# Patient Record
Sex: Female | Born: 2000
Health system: Southern US, Community
[De-identification: ages and names within clinical notes are randomized; demographics above are authoritative.]

---
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
## 2018-04-30 DIAGNOSIS — Z30430 Encounter for insertion of intrauterine contraceptive device: Secondary | ICD-10-CM | POA: Diagnosis not present

## 2018-04-30 DIAGNOSIS — Z3202 Encounter for pregnancy test, result negative: Secondary | ICD-10-CM | POA: Diagnosis not present

## 2018-05-09 DIAGNOSIS — N898 Other specified noninflammatory disorders of vagina: Secondary | ICD-10-CM | POA: Diagnosis not present

## 2018-05-09 DIAGNOSIS — Z30431 Encounter for routine checking of intrauterine contraceptive device: Secondary | ICD-10-CM | POA: Diagnosis not present

## 2018-05-09 DIAGNOSIS — Z113 Encounter for screening for infections with a predominantly sexual mode of transmission: Secondary | ICD-10-CM | POA: Diagnosis not present

## 2018-07-01 DIAGNOSIS — Z30431 Encounter for routine checking of intrauterine contraceptive device: Secondary | ICD-10-CM | POA: Diagnosis not present

## 2018-09-04 DIAGNOSIS — Z975 Presence of (intrauterine) contraceptive device: Secondary | ICD-10-CM | POA: Diagnosis not present

## 2018-10-18 ENCOUNTER — Encounter: Payer: Self-pay | Admitting: Emergency Medicine

## 2018-10-18 ENCOUNTER — Ambulatory Visit
Admission: EM | Admit: 2018-10-18 | Discharge: 2018-10-18 | Disposition: A | Discharge status: Home / Self Care | Payer: BLUE CROSS/BLUE SHIELD | Attending: Family Medicine | Admitting: Family Medicine

## 2018-10-18 ENCOUNTER — Other Ambulatory Visit: Payer: Self-pay

## 2018-10-18 ENCOUNTER — Ambulatory Visit (INDEPENDENT_AMBULATORY_CARE_PROVIDER_SITE_OTHER): Payer: BLUE CROSS/BLUE SHIELD

## 2018-10-18 DIAGNOSIS — S8001XA Contusion of right knee, initial encounter: Secondary | ICD-10-CM

## 2018-10-18 DIAGNOSIS — S83401A Sprain of unspecified collateral ligament of right knee, initial encounter: Secondary | ICD-10-CM

## 2018-10-18 DIAGNOSIS — S93601A Unspecified sprain of right foot, initial encounter: Secondary | ICD-10-CM

## 2018-10-18 DIAGNOSIS — W541XXA Struck by dog, initial encounter: Secondary | ICD-10-CM | POA: Diagnosis not present

## 2018-10-18 DIAGNOSIS — M25561 Pain in right knee: Secondary | ICD-10-CM | POA: Diagnosis not present

## 2018-10-18 DIAGNOSIS — M79671 Pain in right foot: Secondary | ICD-10-CM | POA: Diagnosis not present

## 2018-10-18 DIAGNOSIS — S99921A Unspecified injury of right foot, initial encounter: Secondary | ICD-10-CM | POA: Diagnosis not present

## 2018-10-18 NOTE — ED Triage Notes (Signed)
Patient states that [place] dog knocked her down. Patient c/o right knee and right big toe pain.

## 2018-10-18 NOTE — Discharge Instructions (Addendum)
Continue ice, ibuprofen and acetaminophen, crutches for support Follow up with orthopedist if no improvement over the next week

## 2018-10-18 NOTE — ED Provider Notes (Signed)
[facility]    CSN: [id number] Arrival date & time: 10/18/18  1138     History   Chief Complaint Chief Complaint  Patient presents with  . Fall  . Knee Pain    right  . Toe Pain    right big toe    HPI Claudia Tanner is a 18 y.o. female.   18 yo female with a c/o right knee and right foot pain for the past 5 days after falling when a family dog knocked her down. Patient has been icing and using crutches. Has been able to bear weight but it is painful.   Fall   Knee Pain  Toe Pain     History reviewed. No pertinent past medical history.  There are no active problems to display for this patient.   History reviewed. No pertinent surgical history.  OB History   No obstetric history on file.      Home Medications    Prior to Admission medications   Medication Sig Start Date End Date Taking? Authorizing Provider  MONONESSA 0.25-35 MG-MCG tablet take 1 tablet by mouth once daily 11/02/17   Duanne Limerick, MD  SPRINTEC 28 0.25-35 MG-MCG tablet Take 1 tablet by mouth daily. 03/01/17   [provider]  valACYclovir (VALTREX) 1000 MG tablet Take 1 tablet (1,000 mg total) by mouth daily. 03/15/17   Duanne Limerick, MD    Family History Family History  Problem Relation Age of Onset  . Healthy Mother   . Healthy Father     Social History Social History   Tobacco Use  . Smoking status: Former Smoker    Packs/day: 0.50    Years: 3.00    Pack years: 1.50    Types: Cigarettes  . Smokeless tobacco: Never Used  Substance Use Topics  . Alcohol use: No  . Drug use: No     Allergies   Patient has no known allergies.   Review of Systems Review of Systems   Physical Exam Triage Vital Signs ED Triage Vitals  Enc Vitals Group     BP 10/18/18 1213 111/70     Pulse Rate 10/18/18 1213 61     Resp 10/18/18 1213 14     Temp 10/18/18 1213 98.1 F (36.7 C)     Temp Source 10/18/18 1213 Oral     SpO2 10/18/18 1213 100 %     Weight  10/18/18 1210 130 lb (59 kg)     Height 10/18/18 1210 5\' 6"  (1.676 m)     Head Circumference --      Peak Flow --      Pain Score 10/18/18 1210 5     Pain Loc --      Pain Edu? --      Excl. in GC? --    No data found.  Updated Vital Signs BP 111/70 (BP Location: Right Arm)   Pulse 61   Temp 98.1 F (36.7 C) (Oral)   Resp 14   Ht 5\' 6"  (1.676 m)   Wt 59 kg   SpO2 100%   BMI 20.98 kg/m   Visual Acuity Right Eye Distance:   Left Eye Distance:   Bilateral Distance:    Right Eye Near:   Left Eye Near:    Bilateral Near:     Physical Exam Vitals signs and nursing note reviewed.  Constitutional:      General: She is not in acute distress.    Appearance: She is  not toxic-appearing or diaphoretic.  Musculoskeletal:     Right knee: She exhibits swelling (mild) and bony tenderness. She exhibits normal range of motion, no effusion, no ecchymosis, no deformity, no laceration, no erythema and no MCL laxity. Tenderness (diffuse) found.     Right foot: Normal range of motion and normal capillary refill. Bony tenderness (1st metarsal and mid food area) present. No swelling, crepitus, deformity or laceration.  Neurological:     Mental Status: She is alert.      UC Treatments / Results  Labs (all labs ordered are listed, but only abnormal results are displayed) Labs Reviewed - No data to display  EKG None  Radiology Dg Knee Complete 4 Views Right  Result Date: 10/18/2018 CLINICAL DATA:  Right knee pain EXAM: RIGHT KNEE - COMPLETE 4+ VIEW COMPARISON:  None. FINDINGS: No evidence of fracture, dislocation, or joint effusion. No evidence of arthropathy or other focal bone abnormality. Soft tissues are unremarkable. IMPRESSION: Negative. Electronically Signed   By: Elige KoHetal  Patel   On: 10/18/2018 12:55   Dg Foot Complete Right  Result Date: 10/18/2018 CLINICAL DATA:  Right foot pain status post fall EXAM: RIGHT FOOT COMPLETE - 3+ VIEW COMPARISON:  None. FINDINGS: There is no  evidence of fracture or dislocation. There is no evidence of arthropathy or other focal bone abnormality. Soft tissues are unremarkable. IMPRESSION: Negative. Electronically Signed   By: Elige KoHetal  Patel   On: 10/18/2018 12:55    Procedures Procedures (including critical care time)  Medications Ordered in UC Medications - No data to display  Initial Impression / Assessment and Plan / UC Course  I have reviewed the triage vital signs and the nursing notes.  Pertinent labs & imaging results that were available during my care of the patient were reviewed by me and considered in my medical decision making (see chart for details).      Final Clinical Impressions(s) / UC Diagnoses   Final diagnoses:  Sprain of collateral ligament of right knee, initial encounter  Contusion of right knee, initial encounter  Foot sprain, right, initial encounter     Discharge Instructions     Continue ice, ibuprofen and acetaminophen, crutches for support Follow up with orthopedist if no improvement over the next week    ED Prescriptions    None      1. x-ray results and diagnosis reviewed with patient 2. Recommend supportive treatment as above 3. Follow-up prn if symptoms worsen or don't improve  Controlled Substance Prescriptions [place] Controlled Substance Registry consulted? Not Applicable   Claudia Tanner, Dalaysia Harms, MD 10/18/18 [phone]

## 2018-10-21 DIAGNOSIS — S8391XA Sprain of unspecified site of right knee, initial encounter: Secondary | ICD-10-CM | POA: Diagnosis not present

## 2018-12-11 DIAGNOSIS — Z6823 Body mass index (BMI) 23.0-23.9, adult: Secondary | ICD-10-CM | POA: Diagnosis not present

## 2018-12-11 DIAGNOSIS — Z1389 Encounter for screening for other disorder: Secondary | ICD-10-CM | POA: Diagnosis not present

## 2018-12-11 DIAGNOSIS — Z01419 Encounter for gynecological examination (general) (routine) without abnormal findings: Secondary | ICD-10-CM | POA: Diagnosis not present

## 2018-12-11 DIAGNOSIS — Z113 Encounter for screening for infections with a predominantly sexual mode of transmission: Secondary | ICD-10-CM | POA: Diagnosis not present

## 2019-01-02 ENCOUNTER — Telehealth: Payer: Self-pay | Admitting: Family Medicine

## 2019-01-02 ENCOUNTER — Other Ambulatory Visit: Payer: Self-pay

## 2019-01-02 ENCOUNTER — Emergency Department
Admission: EM | Admit: 2019-01-02 | Discharge: 2019-01-02 | Disposition: A | Discharge status: Home / Self Care | Payer: BC Managed Care – PPO | Attending: Emergency Medicine | Admitting: Emergency Medicine

## 2019-01-02 ENCOUNTER — Emergency Department: Payer: BC Managed Care – PPO

## 2019-01-02 DIAGNOSIS — Z3A01 Less than 8 weeks gestation of pregnancy: Secondary | ICD-10-CM | POA: Diagnosis not present

## 2019-01-02 DIAGNOSIS — R1031 Right lower quadrant pain: Secondary | ICD-10-CM | POA: Insufficient documentation

## 2019-01-02 DIAGNOSIS — O209 Hemorrhage in early pregnancy, unspecified: Secondary | ICD-10-CM | POA: Diagnosis not present

## 2019-01-02 DIAGNOSIS — N939 Abnormal uterine and vaginal bleeding, unspecified: Secondary | ICD-10-CM

## 2019-01-02 DIAGNOSIS — Z87891 Personal history of nicotine dependence: Secondary | ICD-10-CM | POA: Diagnosis not present

## 2019-01-02 DIAGNOSIS — O3680X0 Pregnancy with inconclusive fetal viability, not applicable or unspecified: Secondary | ICD-10-CM | POA: Diagnosis not present

## 2019-01-02 DIAGNOSIS — Z79899 Other long term (current) drug therapy: Secondary | ICD-10-CM | POA: Insufficient documentation

## 2019-01-02 DIAGNOSIS — R1011 Right upper quadrant pain: Secondary | ICD-10-CM | POA: Diagnosis not present

## 2019-01-02 LAB — HCG, QUANTITATIVE, PREGNANCY: hCG, Beta Chain, Quant, S: 604 m[IU]/mL — ABNORMAL HIGH (ref <5)

## 2019-01-02 LAB — URINALYSIS, COMPLETE (UACMP) WITH MICROSCOPIC
Bacteria, UA: NONE SEEN
Bilirubin Urine: NEGATIVE
Glucose, UA: NEGATIVE mg/dL
Ketones, ur: NEGATIVE mg/dL
Leukocytes,Ua: NEGATIVE
Nitrite: NEGATIVE
Protein, ur: NEGATIVE mg/dL
Specific Gravity, Urine: 1.025 (ref 1.005–1.030)
pH: 6 (ref 5.0–8.0)

## 2019-01-02 LAB — COMPREHENSIVE METABOLIC PANEL
ALT: 17 U/L (ref 0–44)
AST: 19 U/L (ref 15–41)
Albumin: 4.3 g/dL (ref 3.5–5.0)
Alkaline Phosphatase: 75 U/L (ref 38–126)
Anion gap: 9 (ref 5–15)
BUN: 11 mg/dL (ref 6–20)
CO2: 25 mmol/L (ref 22–32)
Calcium: 9.5 mg/dL (ref 8.9–10.3)
Chloride: 104 mmol/L (ref 98–111)
Creatinine, Ser: 0.55 mg/dL (ref 0.44–1.00)
GFR calc Af Amer: >60 mL/min (ref >60)
GFR calc non Af Amer: >60 mL/min (ref >60)
Glucose, Bld: 90 mg/dL (ref 70–99)
Potassium: 4.2 mmol/L (ref 3.5–5.1)
Sodium: 138 mmol/L (ref 135–145)
Total Bilirubin: 0.6 mg/dL (ref 0.3–1.2)
Total Protein: 7.3 g/dL (ref 6.5–8.1)

## 2019-01-02 LAB — CBC
HCT: 43.2 % (ref 36.0–46.0)
Hemoglobin: 14.4 g/dL (ref 12.0–15.0)
MCH: 30.7 pg (ref 26.0–34.0)
MCHC: 33.3 g/dL (ref 30.0–36.0)
MCV: 92.1 fL (ref 80.0–100.0)
Platelets: 238 10*3/uL (ref 150–400)
RBC: 4.69 MIL/uL (ref 3.87–5.11)
RDW: 11.7 % (ref 11.5–15.5)
WBC: 7.8 10*3/uL (ref 4.0–10.5)
nRBC: 0 % (ref 0.0–0.2)

## 2019-01-02 LAB — POCT PREGNANCY, URINE: Preg Test, Ur: POSITIVE — AB

## 2019-01-02 LAB — LIPASE, BLOOD: Lipase: 28 U/L (ref 11–51)

## 2019-01-02 LAB — ABO/RH: ABO/RH(D): B POS

## 2019-01-02 MED ORDER — SODIUM CHLORIDE 0.9% FLUSH: 3.0000 mL | Freq: Once | INTRAVENOUS | Status: DC

## 2019-01-02 NOTE — Telephone Encounter (Signed)
Having pain in arm/ chest and abd- go to [place] spoke to her

## 2019-01-02 NOTE — Discharge Instructions (Addendum)
Your hCG level was 604.  Your ultrasound shows no obvious intrauterine pregnancy or ectopic pregnancy.  You need to come back on Saturday morning for repeat hCG level and reevaluation.  If prior to that he developed worsening abdominal pain, fevers, vaginal bleeding you should return to the ER immediately.  I am concerned that this could be an ectopic pregnancy.  You should call your OB/GYN doctor to have a appointment scheduled for Monday.  Please have low threshold to return to the ER

## 2019-01-02 NOTE — ED Notes (Signed)
Dr Jari Pigg at bedside performing pelvic exam w/ [facility] EDT.

## 2019-01-02 NOTE — ED Notes (Signed)
Patient transported to Ultrasound 

## 2019-01-02 NOTE — ED Triage Notes (Signed)
Pt comes via POV from home with c/o abdominal pain that started last night. Pt states she was in a store and had a sharp pain shoot down from her collar bone and shoulder.  Pt states it hurts in her belly to cough. Pt states it is under her right rib.  Pt denies any recent injuries or falls.

## 2019-01-02 NOTE — ED Provider Notes (Signed)
[facility] Emergency Department Provider Note  ____________________________________________   First MD Initiated Contact with Patient 01/02/19 1249     (approximate)  I have reviewed the triage vital signs and the nursing notes.   HISTORY  Chief Complaint Abdominal Pain    HPI Claudia Tanner is a 18 y.o. female otherwise healthy who presents with abdominal pain that started last night.  Patient endorses having right upper quadrant and lower quadrant abdominal pain that started yesterday, intermittent, worse with moving around, nothing makes it better.  Patient does have an IUD.  Her last menstrual period was on August 2 which came little bit earlier than normal.  It was heavier in nature.  Denies any vomiting or fevers Denies chest pain, sob.           History reviewed. No pertinent past medical history.  There are no active problems to display for this patient.   History reviewed. No pertinent surgical history.  Prior to Admission medications   Medication Sig Start Date End Date Taking? Authorizing Provider  MONONESSA 0.25-35 MG-MCG tablet take 1 tablet by mouth once daily 11/02/17   Juline Patch, MD  SPRINTEC 28 0.25-35 MG-MCG tablet Take 1 tablet by mouth daily. 03/01/17   [provider]  valACYclovir (VALTREX) 1000 MG tablet Take 1 tablet (1,000 mg total) by mouth daily. 03/15/17   Juline Patch, MD    Allergies Patient has no known allergies.  Family History  Problem Relation Age of Onset  . Healthy Mother   . Healthy Father     Social History Social History   Tobacco Use  . Smoking status: Former Smoker    Packs/day: 0.50    Years: 3.00    Pack years: 1.50    Types: Cigarettes  . Smokeless tobacco: Never Used  Substance Use Topics  . Alcohol use: No  . Drug use: No      Review of Systems Constitutional: No fever/chills Eyes: No visual changes. ENT: No sore throat. Cardiovascular: Denies chest pain.  Respiratory: Denies shortness of breath. Gastrointestinal: positive abdominal pain. No nausea, no vomiting.  No diarrhea.  No constipation. Genitourinary: Negative for dysuria. Musculoskeletal: Negative for back pain. Skin: Negative for rash. Neurological: Negative for headaches, focal weakness or numbness. All other ROS negative ____________________________________________   PHYSICAL EXAM:  VITAL SIGNS: ED Triage Vitals [01/02/19 1108]  Enc Vitals Group     BP 121/70     Pulse Rate 76     Resp 18     Temp 99.3 F (37.4 C)     Temp src      SpO2 100 %     Weight 140 lb (63.5 kg)     Height 5\' 5"  (1.651 m)     Head Circumference      Peak Flow      Pain Score 5     Pain Loc      Pain Edu?      Excl. in [place]?     Constitutional: Alert and oriented. Well appearing and in no acute distress. Eyes: Conjunctivae are normal. EOMI. Head: Atraumatic. Nose: No congestion/rhinnorhea. Mouth/Throat: Mucous membranes are moist.   Neck: No stridor. Trachea Midline. FROM Cardiovascular: Normal rate, regular rhythm. Grossly normal heart sounds.  Good peripheral circulation. Respiratory: Normal respiratory effort.  No retractions. Lungs CTAB. Gastrointestinal: tender in the RLQ and RUQ. No distention. No abdominal bruits.  Musculoskeletal: No lower extremity tenderness nor edema.  No joint effusions. Neurologic:  Normal speech and language. No gross focal neurologic deficits are appreciated.  Skin:  Skin is warm, dry and intact. No rash noted. Psychiatric: Mood and affect are normal. Speech and behavior are normal. GU: Deferred   ____________________________________________   LABS (all labs ordered are listed, but only abnormal results are displayed)  Labs Reviewed  URINALYSIS, COMPLETE (UACMP) WITH MICROSCOPIC - Abnormal; Notable for the following components:      Result Value   Color, Urine YELLOW (*)    APPearance CLOUDY (*)    Hgb urine dipstick SMALL (*)    All other  components within normal limits  HCG, QUANTITATIVE, PREGNANCY - Abnormal; Notable for the following components:   hCG, Beta Chain, Quant, S 604 (*)    All other components within normal limits  POCT PREGNANCY, URINE - Abnormal; Notable for the following components:   Preg Test, Ur POSITIVE (*)    All other components within normal limits  LIPASE, BLOOD  COMPREHENSIVE METABOLIC PANEL  CBC  POC URINE PREG, ED  ABO/RH   ____________________________________________  RADIOLOGY   Official radiology report(s): [place] Ob Comp Less 14 Wks  Result Date: 01/02/2019 CLINICAL DATA:  Vaginal bleeding, IUD, positive UPT EXAM: TRANSVAGINAL OB ULTRASOUND; OBSTETRIC <14 WK ULTRASOUND TECHNIQUE: Transvaginal ultrasound was performed for complete evaluation of the gestation as well as the maternal uterus, adnexal regions, and pelvic cul-de-sac. COMPARISON:  None. FINDINGS: Gestational age by LMP: 4 weeks 1 day Intrauterine gestational sac: None Yolk sac:  Not Visualized. Embryo:  Not Visualized. Cardiac Activity: Not Visualized. Subchorionic hemorrhage:  None visualized. Maternal uterus/adnexae: Low positioning of the intrauterine device with the body of the IUD positioned low within the cervical canal. Remainder of the maternal structures are unremarkable. No visualized adnexal fluid collection or masses. Small to moderate volume of anechoic free fluid is noted in the cul-de-sac. IMPRESSION: Low positioning of the IUD with the body positioned within the cervical canal. Small to moderate volume free fluid noted within the posterior cul-de-sac, nonspecific. No intrauterine pregnancy visualized. Differential considerations would include early intrauterine pregnancy too early to visualize, spontaneous abortion, or occult ectopic pregnancy. Recommend close clinical followup and serial quantitative beta HCGs and ultrasounds. Electronically Signed   By: Kreg ShropshirePrice  DeHay M.D.   On: 01/02/2019 13:43   [place] Ob Transvaginal   Result Date: 01/02/2019 CLINICAL DATA:  Vaginal bleeding, IUD, positive UPT EXAM: TRANSVAGINAL OB ULTRASOUND; OBSTETRIC <14 WK ULTRASOUND TECHNIQUE: Transvaginal ultrasound was performed for complete evaluation of the gestation as well as the maternal uterus, adnexal regions, and pelvic cul-de-sac. COMPARISON:  None. FINDINGS: Gestational age by LMP: 4 weeks 1 day Intrauterine gestational sac: None Yolk sac:  Not Visualized. Embryo:  Not Visualized. Cardiac Activity: Not Visualized. Subchorionic hemorrhage:  None visualized. Maternal uterus/adnexae: Low positioning of the intrauterine device with the body of the IUD positioned low within the cervical canal. Remainder of the maternal structures are unremarkable. No visualized adnexal fluid collection or masses. Small to moderate volume of anechoic free fluid is noted in the cul-de-sac. IMPRESSION: Low positioning of the IUD with the body positioned within the cervical canal. Small to moderate volume free fluid noted within the posterior cul-de-sac, nonspecific. No intrauterine pregnancy visualized. Differential considerations would include early intrauterine pregnancy too early to visualize, spontaneous abortion, or occult ectopic pregnancy. Recommend close clinical followup and serial quantitative beta HCGs and ultrasounds. Electronically Signed   By: Kreg ShropshirePrice  DeHay M.D.   On: 01/02/2019 13:43    ____________________________________________   PROCEDURES  Procedure(s) performed (  including Critical Care):  Procedures   ____________________________________________   INITIAL IMPRESSION / ASSESSMENT AND PLAN / ED COURSE  Evon SlackShelby N Digilio was evaluated in Emergency Department on 01/02/2019 for the symptoms described in the history of present illness. She was evaluated in the context of the global COVID-19 pandemic, which necessitated consideration that the patient might be at risk for infection with the SARS-CoV-2 virus that causes COVID-19. Institutional  protocols and algorithms that pertain to the evaluation of patients at risk for COVID-19 are in a state of rapid change based on information released by regulatory bodies including the CDC and federal and state organizations. These policies and algorithms were followed during the patient's care in the ED.    Patient is a well-appearing 18 year old who presents with right side abdominal pain.  Patient has a positive pregnancy test so I am concerned mostly about ectopic versus intrauterine pregnancy.  Will get transvaginal ultrasound and hCG quantitative to further evaluate.  I considered appendicitis although patient is afebrile and has had no vomiting so seems less likely.  Considered gallstones although patient has normal LFTs and her pain is both right upper and right lower quadrant.  Will get labs to evaluate for anemia as well as electrolyte abnormalities.  Discussed with patient the possibility of STDs.  Patient was just recently tested 2 weeks ago and was negative.  She has had only one sexual partner over the past few years.  Lipase is normal therefore lower suspicion for pancreatitis.  Normal LFTs and electrolytes.  No evidence of leukocytosis to suggest infection.  UA with 21-50 squamous cells but no evidence of bacteria.  Pregnancy test is positive.  hCG was 604.  Ultrasound did not show evidence of a intrauterine pregnancy or obvious ectopic pregnancy.  I discussed with Dr. Valentino Saxonherry from OB/GYN my concern for ectopic pregnancy given the free fluid as well as no intrauterine pregnancy.   Given that she is hemodynamically stable they would not want to admit her at this time. They would recommend repeat beta-hCG in 2 days.  Given this will be on a Saturday they will have to come back to the ER to have this done given the OB clinic will be closed.  She also recommends coming back for increasing pain recommended taking Tylenol 500.  Otherwise she should schedule follow-up for Monday morning.  GU exam  closed cervix with strings from IUD  3:13 PM evaluate patient.  Patient's vital signs are stable and is having minimal right lower quadrant pain.  Patient understands my concern for ectopic.  She understands that she needs to have a low threshold to return to the ER if she develops any new symptoms or worsening pain.  Otherwise she will come back on Saturday for reevaluation and repeat hCG        ____________________________________________   FINAL CLINICAL IMPRESSION(S) / ED DIAGNOSES   Final diagnoses:  Pregnancy of unknown anatomic location      MEDICATIONS GIVEN DURING THIS VISIT:  Medications  sodium chloride flush (NS) 0.9 % injection 3 mL (has no administration in time range)     ED Discharge Orders    None       Note:  This document was prepared using Dragon voice recognition software and may include unintentional dictation errors.   Concha SeFunke, Michaelanthony Kempton E, MD 01/02/19 1525

## 2019-01-02 NOTE — Telephone Encounter (Signed)
Patient complain last night started with sharp pain under right rib, aslo having pain where her collar bone near right shoulder.

## 2019-01-04 ENCOUNTER — Inpatient Hospital Stay (HOSPITAL_COMMUNITY)
Admission: AD | Admit: 2019-01-04 | Discharge: 2019-01-04 | Disposition: A | Discharge status: Home / Self Care | Payer: BC Managed Care – PPO | Source: Ambulatory Visit | Attending: Obstetrics and Gynecology | Admitting: Obstetrics and Gynecology

## 2019-01-04 ENCOUNTER — Other Ambulatory Visit: Payer: Self-pay

## 2019-01-04 ENCOUNTER — Encounter (HOSPITAL_COMMUNITY): Payer: Self-pay

## 2019-01-04 DIAGNOSIS — O039 Complete or unspecified spontaneous abortion without complication: Secondary | ICD-10-CM

## 2019-01-04 LAB — HCG, QUANTITATIVE, PREGNANCY: hCG, Beta Chain, Quant, S: 521 m[IU]/mL — ABNORMAL HIGH (ref <5)

## 2019-01-04 NOTE — Discharge Instructions (Signed)
-  Dr. Roe Rutherford office will call you by noon on Monday for follow-up appt, if you have not heard from them by noon, give them a call.  -Return to MAU if you develop any increased pain or bleeding.

## 2019-01-04 NOTE — MAU Note (Signed)
ASIANAE MINKLER is a 18 y.o. at [redacted]w[redacted]d here in MAU reporting: here for follow up HCG level, no bleeding, having some left sided abdominal pain that has improved since being seen at [facility]  LMP: 12/22/18, states this was not a normal period for her  Pain score: 2/10  Vitals:   01/04/19 1358  BP: (!) 122/59  Pulse: 75  Resp: 16  Temp: 98.4 F (36.9 C)  SpO2: 99%     Lab orders placed from triage: hcg

## 2019-01-04 NOTE — MAU Provider Note (Addendum)
Patient Claudia Tanner is a 18 y.o. G1P0 At [redacted]w[redacted]d here for follow up bhcg. She was seen at [facility] for right side pain; beta that day as 604 (Aug 13).   Patient denies current pain or bleeding; at night it is hard to sleep because her right side is sore but the pain is much reduced.  She is happy with her IUD but given recent pregnancy, may try a different IUD. Dr. Melba Coon has told her that her her uterus is too small for the copper IUD and that maybe she needs a different size device.   Assessment and Plan   1. Miscarriage    -Beta HCG today is 521, appropriate drop in quant. - Reviewed [place] results and images from 8/13.  - Discussed patient's case with Dr. Terri Piedra, who recommends patient call the office on Monday morning to be seen for follow up beta and repeat US/visit with provider.  -Dr. Terri Piedra requesting chart routed to her; chart routed to her inbox.   Mervyn Skeeters Kooistra 01/04/2019, 3:15 PM

## 2019-01-06 DIAGNOSIS — O021 Missed abortion: Secondary | ICD-10-CM | POA: Diagnosis not present

## 2019-01-07 DIAGNOSIS — Z30432 Encounter for removal of intrauterine contraceptive device: Secondary | ICD-10-CM | POA: Diagnosis not present

## 2019-01-07 DIAGNOSIS — O2630 Retained intrauterine contraceptive device in pregnancy, unspecified trimester: Secondary | ICD-10-CM | POA: Diagnosis not present

## 2019-01-13 DIAGNOSIS — O021 Missed abortion: Secondary | ICD-10-CM | POA: Diagnosis not present

## 2019-01-20 DIAGNOSIS — O021 Missed abortion: Secondary | ICD-10-CM | POA: Diagnosis not present

## 2019-01-28 DIAGNOSIS — O021 Missed abortion: Secondary | ICD-10-CM | POA: Diagnosis not present

## 2019-02-05 DIAGNOSIS — O034 Incomplete spontaneous abortion without complication: Secondary | ICD-10-CM | POA: Diagnosis not present

## 2019-02-18 DIAGNOSIS — Z30430 Encounter for insertion of intrauterine contraceptive device: Secondary | ICD-10-CM | POA: Diagnosis not present

## 2019-02-18 DIAGNOSIS — Z3202 Encounter for pregnancy test, result negative: Secondary | ICD-10-CM | POA: Diagnosis not present

## 2020-02-23 ENCOUNTER — Ambulatory Visit: Payer: Self-pay | Admitting: Podiatry

## 2020-03-02 ENCOUNTER — Ambulatory Visit: Payer: Self-pay | Admitting: Podiatry

## 2020-04-27 IMAGING — US TRANSVAGINAL OB ULTRASOUND
1 series · 13 of 28 positions shown · non-contrast
Comparison: None.

CLINICAL DATA: Vaginal bleeding, IUD, positive UPT

EXAM:
TRANSVAGINAL OB ULTRASOUND; OBSTETRIC <14 WK ULTRASOUND
TECHNIQUE: Transvaginal ultrasound was performed for complete evaluation of the
gestation as well as the maternal uterus, adnexal regions, and
pelvic cul-de-sac.

[Series 1: transvaginal ob ultrasound · 41 acquisitions, 13 frames shown]
[im 2/41]
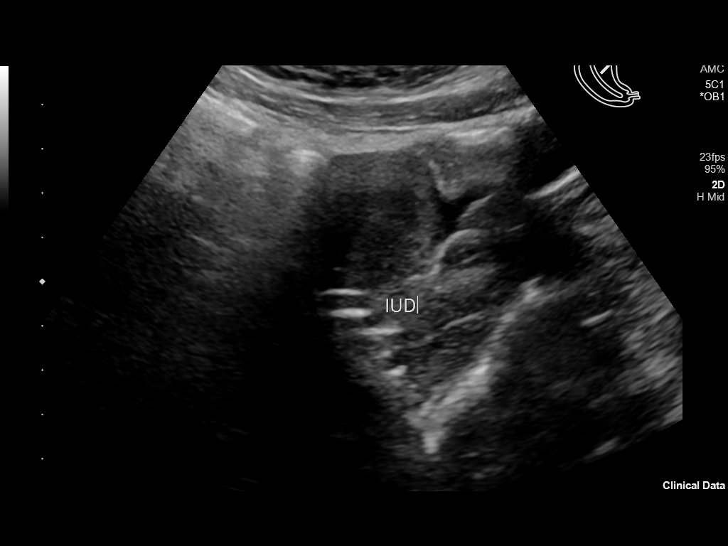
[im 5/41]
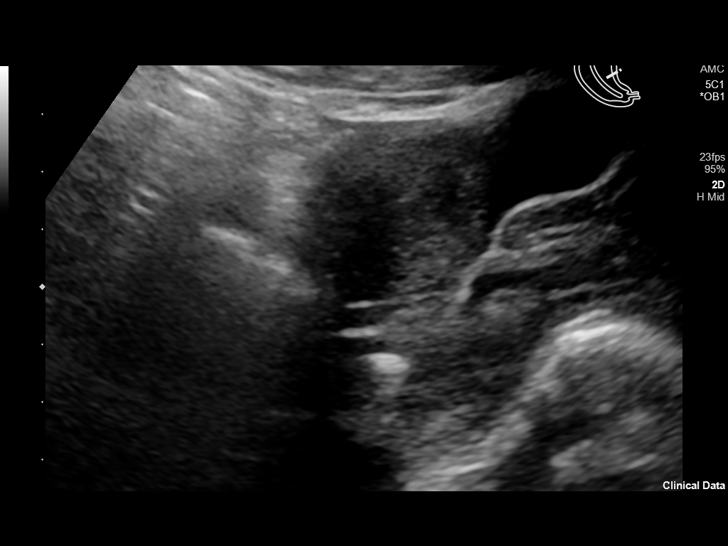
[im 8/41]
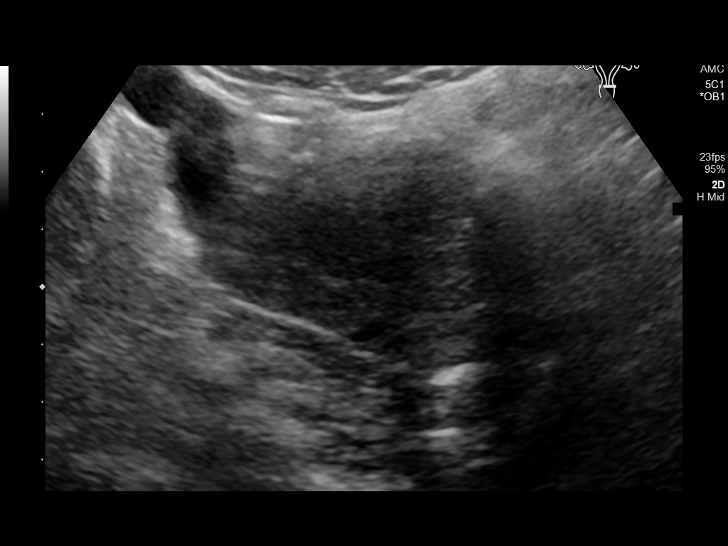
[im 11/41]
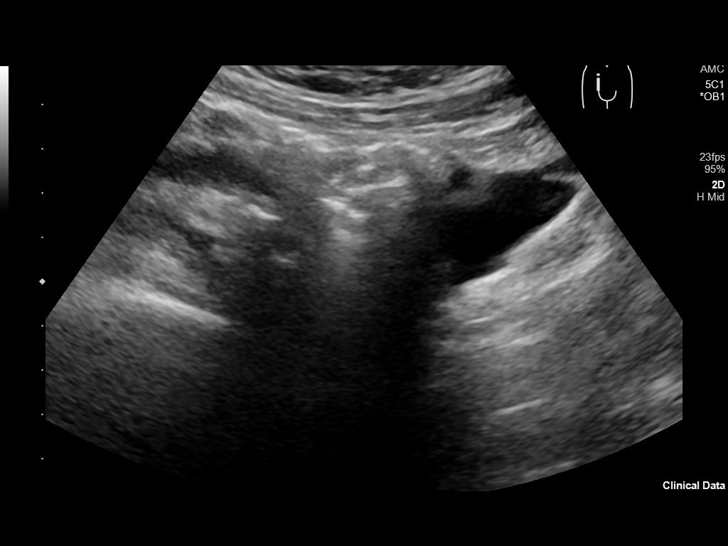
[im 14/41]
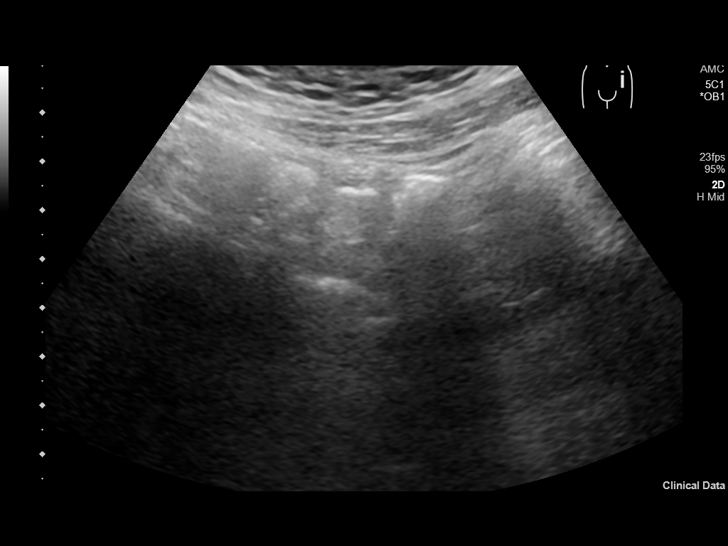
[im 17/41]
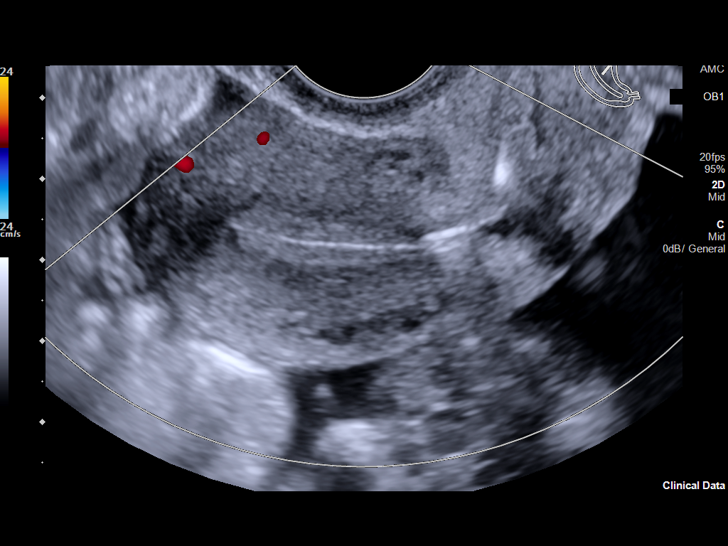
[im 21/41]
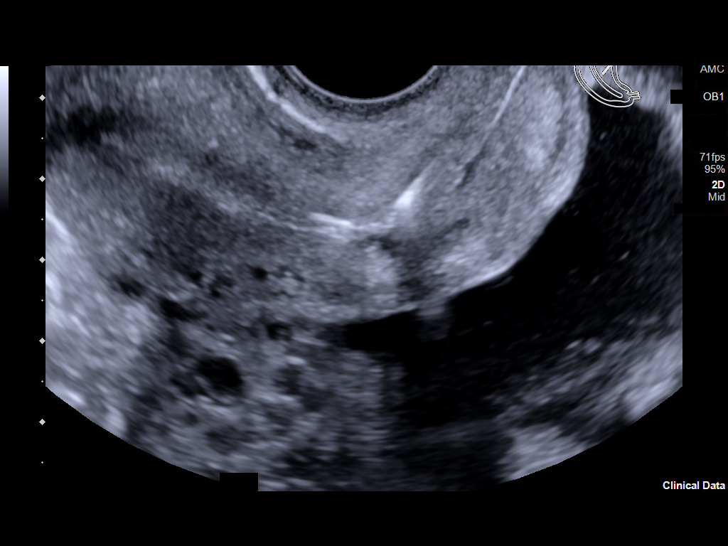
[im 24/41]
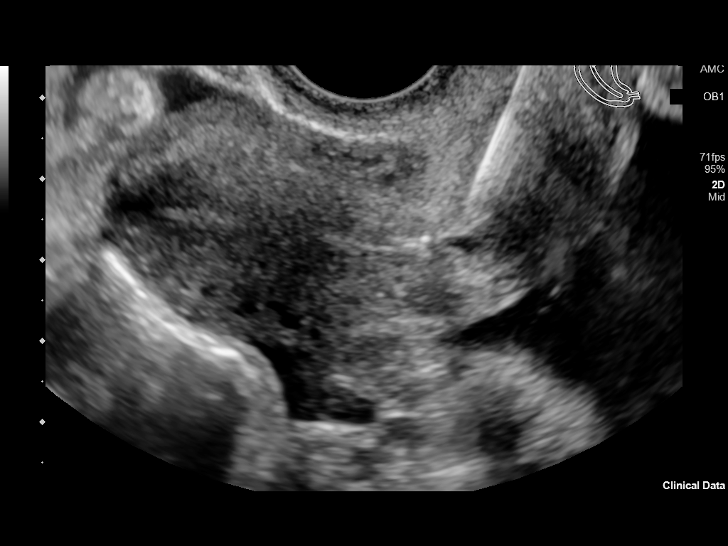
[im 27/41]
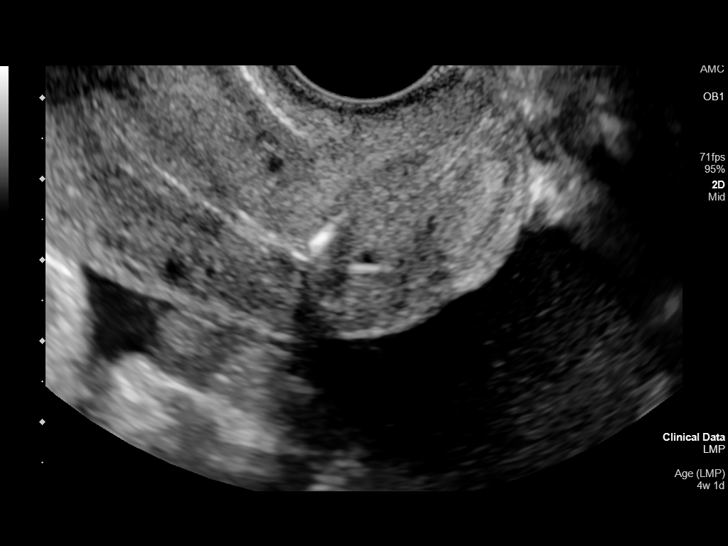
[im 30/41]
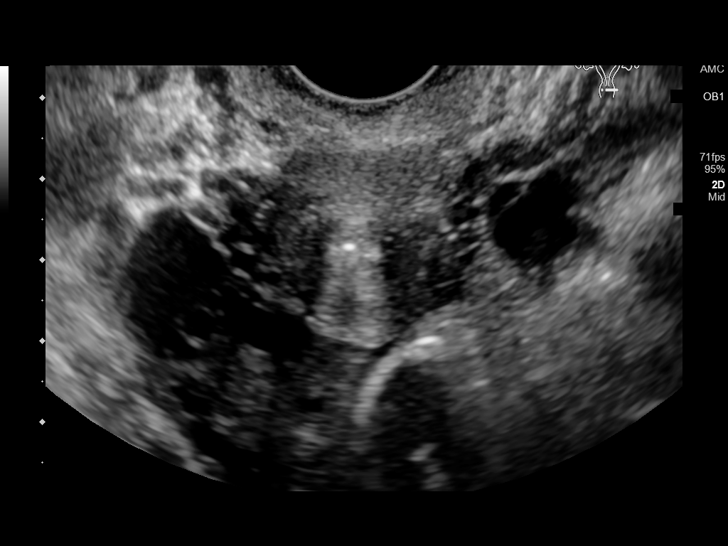
[im 33/41]
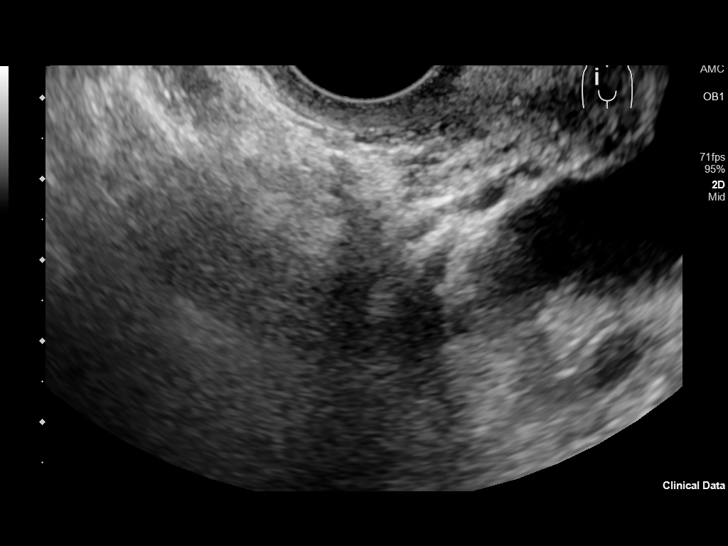
[im 36/41]
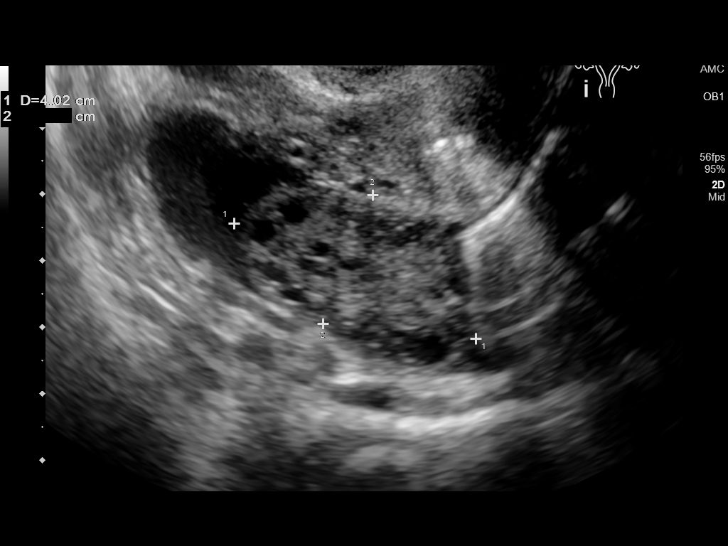
[im 39/41]
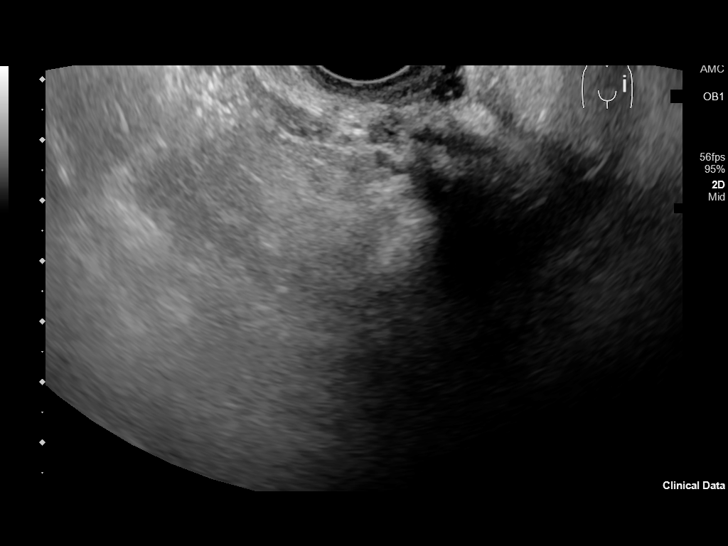

[13 of 28 positions shown; findings below may reference images not displayed]

FINDINGS: Gestational age by LMP: 4 weeks 1 day

Intrauterine gestational sac: None

Yolk sac:  Not Visualized.

Embryo:  Not Visualized.

Cardiac Activity: Not Visualized.

Subchorionic hemorrhage:  None visualized.

Maternal uterus/adnexae: Low positioning of the intrauterine device
with the body of the IUD positioned low within the cervical canal.
Remainder of the maternal structures are unremarkable. No visualized
adnexal fluid collection or masses. Small to moderate volume of
anechoic free fluid is noted in the cul-de-sac.
IMPRESSION: Low positioning of the IUD with the body positioned within the
cervical canal.

Small to moderate volume free fluid noted within the posterior
cul-de-sac, nonspecific.

No intrauterine pregnancy visualized. Differential considerations
would include early intrauterine pregnancy too early to visualize,
spontaneous abortion, or occult ectopic pregnancy. Recommend close
clinical followup and serial quantitative beta HCGs and ultrasounds.

## 2021-01-03 ENCOUNTER — Other Ambulatory Visit: Payer: Self-pay

## 2021-01-03 ENCOUNTER — Ambulatory Visit
Admission: EM | Admit: 2021-01-03 | Discharge: 2021-01-03 | Disposition: A | Discharge status: Home / Self Care | Payer: Managed Care, Other (non HMO) | Attending: Physician Assistant | Admitting: Physician Assistant

## 2021-01-03 DIAGNOSIS — N10 Acute tubulo-interstitial nephritis: Secondary | ICD-10-CM | POA: Diagnosis not present

## 2021-01-03 LAB — URINALYSIS, COMPLETE (UACMP) WITH MICROSCOPIC
Bilirubin Urine: NEGATIVE
Glucose, UA: NEGATIVE mg/dL
Nitrite: NEGATIVE
Specific Gravity, Urine: 1.02 (ref 1.005–1.030)
WBC, UA: >50 WBC/hpf (ref 0–5)
pH: 7 (ref 5.0–8.0)

## 2021-01-03 MED ORDER — CIPROFLOXACIN HCL 500 MG PO TABS: 500.0000 mg | ORAL_TABLET | Freq: Two times a day (BID) | ORAL | 0 refills | Status: AC

## 2021-01-03 NOTE — ED Triage Notes (Signed)
Pt presents with c/o left flank pain that developed over the weekend, states she was having dyuria earlier and took azo

## 2021-01-03 NOTE — ED Provider Notes (Signed)
[facility]    CSN: [id number] Arrival date & time: 01/03/21  1228      History   Chief Complaint Chief Complaint  Patient presents with   Dysuria   Flank Pain    HPI Claudia Tanner is a 20 y.o. female.   Patient is a 20 year old female who presents with concern for possible left kidney infection.  Patient states she thought she started having a UTI on Wednesday night and states that come and go fairly often.  She took over-the-counter Azo Wednesday night through Friday morning.  She states the pain had been intermittent but then since Saturday the pain has been more constant is worse with movement.  She also reports some tenderness to her left side.  Patient denies any allergies to medications.  She does not take anything like ibuprofen or Tylenol. Is  History reviewed. No pertinent past medical history.  There are no problems to display for this patient.   History reviewed. No pertinent surgical history.  OB History     Gravida  1   Para      Term      Preterm      AB      Living         SAB      IAB      Ectopic      Multiple      Live Births               Home Medications    Prior to Admission medications   Medication Sig Start Date End Date Taking? Authorizing Provider  ciprofloxacin (CIPRO) 500 MG tablet Take 1 tablet (500 mg total) by mouth 2 (two) times daily for 10 days. 01/03/21 01/13/21 Yes Candis Schatz, PA-C  MONONESSA 0.25-35 MG-MCG tablet take 1 tablet by mouth once daily 11/02/17   Duanne Limerick, MD  SPRINTEC 28 0.25-35 MG-MCG tablet Take 1 tablet by mouth daily. 03/01/17   [provider]  valACYclovir (VALTREX) 1000 MG tablet Take 1 tablet (1,000 mg total) by mouth daily. 03/15/17   Duanne Limerick, MD    Family History Family History  Problem Relation Age of Onset   Healthy Mother    Healthy Father     Social History Social History   Tobacco Use   Smoking status: Former    Packs/day: 0.50     Years: 3.00    Pack years: 1.50    Types: Cigarettes   Smokeless tobacco: Never  Vaping Use   Vaping Use: Never used  Substance Use Topics   Alcohol use: No   Drug use: No     Allergies   Patient has no known allergies.   Review of Systems Review of Systems as noted above in HPI other systems reviewed and found to be negative   Physical Exam Triage Vital Signs ED Triage Vitals  Enc Vitals Group     BP 01/03/21 1257 125/72     Pulse Rate 01/03/21 1257 71     Resp 01/03/21 1257 18     Temp 01/03/21 1257 98.1 F (36.7 C)     Temp src --      SpO2 01/03/21 1257 99 %     Weight --      Height --      Head Circumference --      Peak Flow --      Pain Score 01/03/21 1252 5     Pain Loc --  Pain Edu? --      Excl. in [place]? --    No data found.  Updated Vital Signs BP 125/72   Pulse 71   Temp 98.1 F (36.7 C)   Resp 18   SpO2 99%     Physical Exam Constitutional:      Appearance: Normal appearance. She is normal weight. She is not ill-appearing.  Pulmonary:     Effort: Pulmonary effort is normal. No respiratory distress.  Abdominal:     General: Abdomen is flat. There is no distension.     Palpations: Abdomen is soft.     Tenderness: Tenderness: no bladder tenderness. There is left CVA tenderness. There is no right CVA tenderness.  Skin:    General: Skin is warm and dry.     Capillary Refill: Capillary refill takes less than 2 seconds.  Neurological:     General: No focal deficit present.     Mental Status: She is alert and oriented to person, place, and time.     UC Treatments / Results  Labs (all labs ordered are listed, but only abnormal results are displayed) Labs Reviewed  URINALYSIS, COMPLETE (UACMP) WITH MICROSCOPIC - Abnormal; Notable for the following components:      Result Value   APPearance HAZY (*)    Hgb urine dipstick SMALL (*)    Ketones, ur TRACE (*)    Protein, ur TRACE (*)    Leukocytes,Ua SMALL (*)    Bacteria, UA MANY (*)     All other components within normal limits  URINE CULTURE    EKG   Radiology No results found.  Procedures Procedures (including critical care time)  Medications Ordered in UC Medications - No data to display  Initial Impression / Assessment and Plan / UC Course  I have reviewed the triage vital signs and the nursing notes.  Pertinent labs & imaging results that were available during my care of the patient were reviewed by me and considered in my medical decision making (see chart for details).    Patient with symptoms and exam consistent with left pyelonephritis.  Urinalysis positive for UTI.  We will give her prescription for Cipro.  Have her continue with ibuprofen or Tylenol as needed for pain. May also take Azo for bladder pain.  Have her push fluids and follow-up with her primary care provider as needed.  Final Clinical Impressions(s) / UC Diagnoses   Final diagnoses:  Acute pyelonephritis     Discharge Instructions      -Ciprofloxacin: 1 tablet twice a day for 10 days -Continue over-the-counter Azo, ibuprofen, or Tylenol as needed for pain and discomfort -Push fluids -Follow-up with this clinic or primary care provider should symptoms worsen or not improve. -Will be sent for culture to evaluate proper antibiotic coverage.     ED Prescriptions     Medication Sig Dispense Auth. Provider   ciprofloxacin (CIPRO) 500 MG tablet Take 1 tablet (500 mg total) by mouth 2 (two) times daily for 10 days. 20 tablet Candis Schatz, PA-C      PDMP not reviewed this encounter.   Candis Schatz, PA-C 01/03/21 1812

## 2021-01-03 NOTE — Discharge Instructions (Addendum)
-  Ciprofloxacin: 1 tablet twice a day for 10 days -Continue over-the-counter Azo, ibuprofen, or Tylenol as needed for pain and discomfort -Push fluids -Follow-up with this clinic or primary care provider should symptoms worsen or not improve. -Will be sent for culture to evaluate proper antibiotic coverage.

## 2021-01-06 LAB — URINE CULTURE: Culture: 20000 — AB
